# Patient Record
Sex: Male | Born: 1975 | Race: White | Hispanic: No | Marital: Married | Smoking: Never smoker
Health system: Southern US, Community
[De-identification: ages and names within clinical notes are randomized; demographics above are authoritative.]

---

## 2017-02-09 ENCOUNTER — Emergency Department: Payer: Worker's Compensation

## 2017-02-09 ENCOUNTER — Emergency Department
Admission: EM | Admit: 2017-02-09 | Discharge: 2017-02-10 | Disposition: A | Discharge status: Other Acute Inpatient Hospital | Payer: Worker's Compensation | Attending: Emergency Medicine | Admitting: Emergency Medicine

## 2017-02-09 DIAGNOSIS — Y939 Activity, unspecified: Secondary | ICD-10-CM | POA: Insufficient documentation

## 2017-02-09 DIAGNOSIS — S0990XA Unspecified injury of head, initial encounter: Secondary | ICD-10-CM | POA: Diagnosis not present

## 2017-02-09 DIAGNOSIS — Y999 Unspecified external cause status: Secondary | ICD-10-CM | POA: Diagnosis not present

## 2017-02-09 DIAGNOSIS — Y929 Unspecified place or not applicable: Secondary | ICD-10-CM | POA: Diagnosis not present

## 2017-02-09 MED ORDER — PROPOFOL 1000 MG/100ML IV EMUL
INTRAVENOUS | Status: AC
  Filled 2017-02-09: qty 100
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
## 2017-02-10 NOTE — ED Notes (Signed)
PT D/C TO [facility] ON 02/09/17 @ 1945. PT TAKEN OFF FLOOR AND NOT D/C OFF SYSTEM

## 2017-09-21 ENCOUNTER — Other Ambulatory Visit: Payer: Self-pay | Admitting: Neurology

## 2017-09-21 DIAGNOSIS — R569 Unspecified convulsions: Secondary | ICD-10-CM

## 2017-09-29 ENCOUNTER — Ambulatory Visit: Admission: RE | Admit: 2017-09-29 | Payer: BLUE CROSS/BLUE SHIELD | Source: Ambulatory Visit

## 2019-01-14 ENCOUNTER — Encounter: Payer: Self-pay | Admitting: Emergency Medicine

## 2019-01-14 ENCOUNTER — Emergency Department
Admission: EM | Admit: 2019-01-14 | Discharge: 2019-01-14 | Disposition: A | Discharge status: Home / Self Care | Payer: BLUE CROSS/BLUE SHIELD | Attending: Emergency Medicine | Admitting: Emergency Medicine

## 2019-01-14 ENCOUNTER — Emergency Department: Payer: BLUE CROSS/BLUE SHIELD

## 2019-01-14 ENCOUNTER — Other Ambulatory Visit: Payer: Self-pay

## 2019-01-14 DIAGNOSIS — J40 Bronchitis, not specified as acute or chronic: Secondary | ICD-10-CM

## 2019-01-14 DIAGNOSIS — R509 Fever, unspecified: Secondary | ICD-10-CM | POA: Diagnosis not present

## 2019-01-14 DIAGNOSIS — Z79899 Other long term (current) drug therapy: Secondary | ICD-10-CM | POA: Insufficient documentation

## 2019-01-14 DIAGNOSIS — R05 Cough: Secondary | ICD-10-CM | POA: Diagnosis present

## 2019-01-14 DIAGNOSIS — U071 COVID-19: Secondary | ICD-10-CM | POA: Diagnosis not present

## 2019-01-14 LAB — CBC WITH DIFFERENTIAL/PLATELET
Abs Immature Granulocytes: 0.03 10*3/uL (ref 0.00–0.07)
Basophils Absolute: 0 10*3/uL (ref 0.0–0.1)
Basophils Relative: 1 %
Eosinophils Absolute: 0.1 10*3/uL (ref 0.0–0.5)
Eosinophils Relative: 1 %
HCT: 40.5 % (ref 39.0–52.0)
Hemoglobin: 13.3 g/dL (ref 13.0–17.0)
Immature Granulocytes: 0 %
Lymphocytes Relative: 19 %
Lymphs Abs: 1.3 10*3/uL (ref 0.7–4.0)
MCH: 27.3 pg (ref 26.0–34.0)
MCHC: 32.8 g/dL (ref 30.0–36.0)
MCV: 83 fL (ref 80.0–100.0)
Monocytes Absolute: 0.5 10*3/uL (ref 0.1–1.0)
Monocytes Relative: 7 %
Neutro Abs: 5 10*3/uL (ref 1.7–7.7)
Neutrophils Relative %: 72 %
Platelets: 225 10*3/uL (ref 150–400)
RBC: 4.88 MIL/uL (ref 4.22–5.81)
RDW: 13 % (ref 11.5–15.5)
WBC: 6.8 10*3/uL (ref 4.0–10.5)
nRBC: 0 % (ref 0.0–0.2)

## 2019-01-14 LAB — COMPREHENSIVE METABOLIC PANEL
ALT: 28 U/L (ref 0–44)
AST: 31 U/L (ref 15–41)
Albumin: 4.3 g/dL (ref 3.5–5.0)
Alkaline Phosphatase: 67 U/L (ref 38–126)
Anion gap: 9 (ref 5–15)
BUN: 18 mg/dL (ref 6–20)
CO2: 19 mmol/L — ABNORMAL LOW (ref 22–32)
Calcium: 9 mg/dL (ref 8.9–10.3)
Chloride: 110 mmol/L (ref 98–111)
Creatinine, Ser: 0.87 mg/dL (ref 0.61–1.24)
GFR calc Af Amer: >60 mL/min (ref >60)
GFR calc non Af Amer: >60 mL/min (ref >60)
Glucose, Bld: 112 mg/dL — ABNORMAL HIGH (ref 70–99)
Potassium: 3.8 mmol/L (ref 3.5–5.1)
Sodium: 138 mmol/L (ref 135–145)
Total Bilirubin: 0.6 mg/dL (ref 0.3–1.2)
Total Protein: 8.2 g/dL — ABNORMAL HIGH (ref 6.5–8.1)

## 2019-01-14 NOTE — ED Provider Notes (Signed)
[facility] Emergency Department Provider Note   ____________________________________________   First MD Initiated Contact with Patient 01/14/19 1150     (approximate)  I have reviewed the triage vital signs and the nursing notes.   HISTORY  Chief Complaint Fever and Cough    HPI Bradley Gallegos is a 43 y.o. male patient is a known positive COVID patient.  He is reports cough and a fever.  Fever last night was at 102.  Cough is been dry except for he coughed up a fleck of blood about the size of a pinhead earlier today.  He is been coughing since December.  Not really short of breath.  He was thought to have flu in December.  They gave him some Tamiflu but he did not take it down because before he could get it filled he got better.  He is taking it now.  I told him I did not know that it would do any good but it should not hurt him.  His temperature here is 99.  His O2 sats are good.         History reviewed. No pertinent past medical history.  There are no active problems to display for this patient.   History reviewed. No pertinent surgical history.  Prior to Admission medications   Not on File    Allergies Patient has no known allergies.  History reviewed. No pertinent family history.  Social History Social History   Tobacco Use  . Smoking status: Never Smoker  . Smokeless tobacco: Never Used  Substance Use Topics  . Alcohol use: Not Currently  . Drug use: Not Currently    Review of Systems  Constitutional:  fever/chills Eyes: No visual changes. ENT: No sore throat. Cardiovascular: Denies chest pain. Respiratory: Denies shortness of breath. Gastrointestinal: No abdominal pain.  No nausea, no vomiting.  No diarrhea.  No constipation. Genitourinary: Negative for dysuria. Musculoskeletal: Negative for back pain. Skin: Negative for rash. Neurological: Negative for headaches, focal weakness    ____________________________________________   PHYSICAL EXAM:  VITAL SIGNS: ED Triage Vitals  Enc Vitals Group     BP 01/14/19 1120 (!) 127/57     Pulse Rate 01/14/19 1120 92     Resp 01/14/19 1120 18     Temp 01/14/19 1120 99.1 F (37.3 C)     Temp Source 01/14/19 1120 Oral     SpO2 01/14/19 1120 97 %     Weight --      Height --      Head Circumference --      Peak Flow --      Pain Score 01/14/19 1121 2     Pain Loc --      Pain Edu? --      Excl. in GC? --     Constitutional: Alert and oriented. Well appearing and in no acute distress. Eyes: Conjunctivae are normal.  Head: Atraumatic. Nose: No congestion/rhinnorhea. Mouth/Throat: Mucous membranes are moist.  Oropharynx non-erythematous. Neck: No stridor Cardiovascular: Normal rate, regular rhythm. Grossly normal heart sounds.  Good peripheral circulation. Respiratory: Normal respiratory effort.  No retractions. Lungs CTAB. Gastrointestinal: Soft and nontender. No distention. No abdominal bruits. No CVA tenderness. }Musculoskeletal: No lower extremity tenderness nor edema.  Neurologic:  Normal speech and language. No gross focal neurologic deficits are appreciated.  Skin:  Skin is warm, dry and intact. No rash noted.   ____________________________________________   LABS (all labs ordered are listed, but only abnormal results are  displayed)  Labs Reviewed  COMPREHENSIVE METABOLIC PANEL - Abnormal; Notable for the following components:      Result Value   CO2 19 (*)    Glucose, Bld 112 (*)    Total Protein 8.2 (*)    All other components within normal limits  CULTURE, BLOOD (ROUTINE X 2)  CULTURE, BLOOD (ROUTINE X 2)  CBC WITH DIFFERENTIAL/PLATELET   ____________________________________________  EKG EKG read interpreted by me shows normal sinus rhythm rate of 77 left axis no acute ST-T wave changes  ____________________________________________  RADIOLOGY  ED MD interpretation: X-ray read by radiology  reviewed by me is normal  Official radiology report(s): Dg Chest Portable 1 View  Result Date: 01/14/2019 CLINICAL DATA:  Cough and fever reported COVID-19 positive study EXAM: PORTABLE CHEST 1 VIEW COMPARISON:  February 09, 2017 FINDINGS: Lungs are clear. Heart is upper normal in size with pulmonary vascularity normal. No adenopathy. No bone lesions. IMPRESSION: Lungs clear.  Heart upper normal in size. Electronically Signed   By: Bretta Bang III M.D.   On: 01/14/2019 12:24    ____________________________________________   PROCEDURES  Procedure(s) performed (including Critical Care):  Procedures   ____________________________________________   INITIAL IMPRESSION / ASSESSMENT AND PLAN / ED COURSE  Patient known COVID positive.  He has a cough but not really short of breath his oxygen saturations are good.  He did cough up a little fleck of blood but he has been coughing since December and this is probably not significant.  Lab work is essentially normal.  I instructed him to return at once if he gets any redness of breath.  Or if he gets sicker feeling at all.    Bradley Gallegos was evaluated in Emergency Department on 01/14/2019 for the symptoms described in the history of present illness. He was evaluated in the context of the global COVID-19 pandemic, which necessitated consideration that the patient might be at risk for infection with the SARS-CoV-2 virus that causes COVID-19. Institutional protocols and algorithms that pertain to the evaluation of patients at risk for COVID-19 are in a state of rapid change based on information released by regulatory bodies including the CDC and federal and state organizations. These policies and algorithms were followed during the patient's care in the ED.          ____________________________________________   FINAL CLINICAL IMPRESSION(S) / ED DIAGNOSES  Final diagnoses:  Bronchitis     ED Discharge Orders    None       Note:   This document was prepared using Dragon voice recognition software and may include unintentional dictation errors.    Arnaldo Natal, MD 01/14/19 [phone]

## 2019-01-14 NOTE — Discharge Instructions (Addendum)
There is no evidence of pneumonia on the x-ray.  Your blood work looks okay.  Please return for increasing short of breath or feeling sicker.  Use Tylenol for fever for now.

## 2019-01-14 NOTE — ED Triage Notes (Signed)
Pt to ED via POV c/o cough and fever. Pt states that he has had cough since December. Pt reports taking OTC medication for fever. Pt is in NAD. Pt is COVID +

## 2019-01-19 LAB — CULTURE, BLOOD (ROUTINE X 2)
Culture: NO GROWTH
Culture: NO GROWTH
Special Requests: ADEQUATE
Special Requests: ADEQUATE

## 2020-12-04 IMAGING — DX PORTABLE CHEST - 1 VIEW
1 series · 1 of 1 positions shown · non-contrast
Comparison: February 09, 2017

CLINICAL DATA: Cough and fever reported 1A9GS-CS positive study

EXAM:
PORTABLE CHEST 1 VIEW

[chest ap]
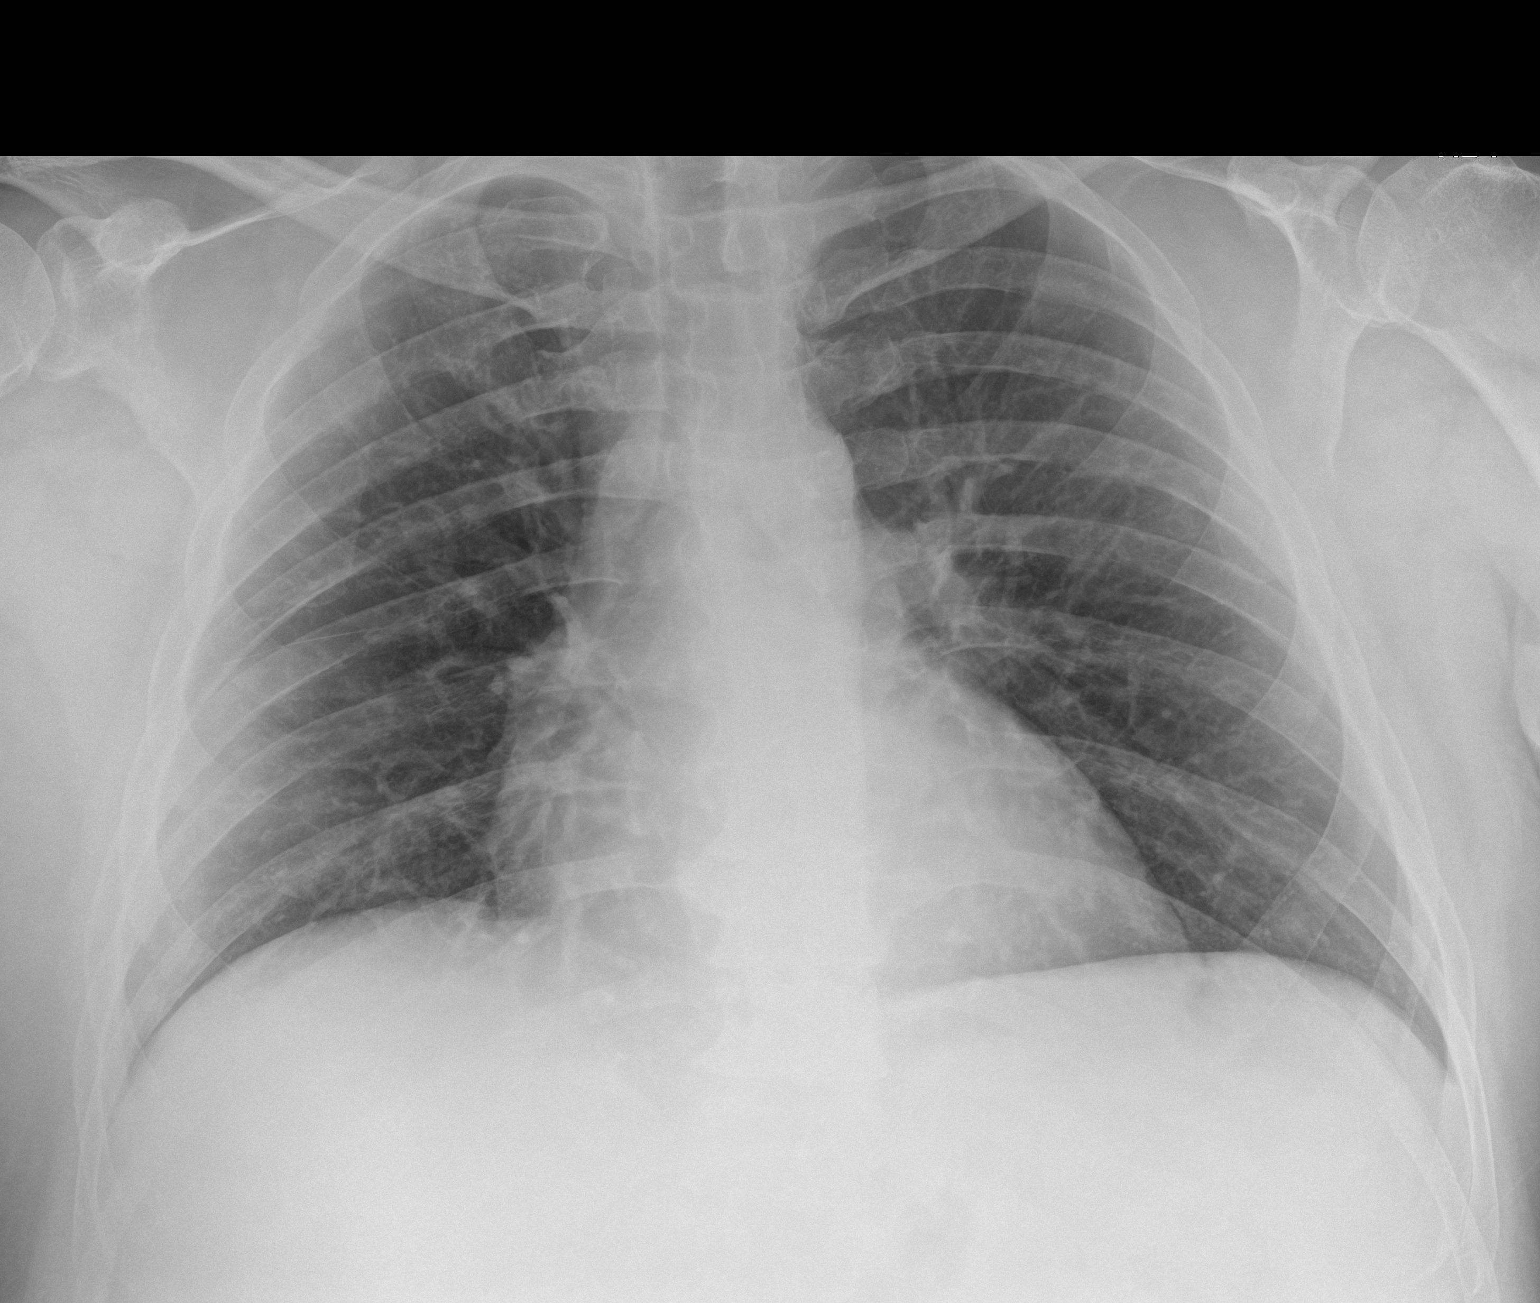

[1 of 1 positions shown; findings below may reference images not displayed]

FINDINGS: Lungs are clear. Heart is upper normal in size with pulmonary
vascularity normal. No adenopathy. No bone lesions.
IMPRESSION: Lungs clear.  Heart upper normal in size.
